# Patient Record
Sex: Male | Born: 1960
Health system: Southern US, Community
[De-identification: ages and names within clinical notes are randomized; demographics above are authoritative.]

## PROBLEM LIST (undated history)

## (undated) DIAGNOSIS — F439 Reaction to severe stress, unspecified: Secondary | ICD-10-CM

## (undated) DIAGNOSIS — F17290 Nicotine dependence, other tobacco product, uncomplicated: Secondary | ICD-10-CM

## (undated) DIAGNOSIS — K30 Functional dyspepsia: Secondary | ICD-10-CM

## (undated) DIAGNOSIS — N281 Cyst of kidney, acquired: Secondary | ICD-10-CM

## (undated) DIAGNOSIS — E663 Overweight: Secondary | ICD-10-CM

## (undated) DIAGNOSIS — K219 Gastro-esophageal reflux disease without esophagitis: Secondary | ICD-10-CM

## (undated) DIAGNOSIS — I10 Essential (primary) hypertension: Secondary | ICD-10-CM

## (undated) DIAGNOSIS — Z86010 Personal history of colonic polyps: Secondary | ICD-10-CM

## (undated) DIAGNOSIS — H409 Unspecified glaucoma: Secondary | ICD-10-CM

## (undated) DIAGNOSIS — J329 Chronic sinusitis, unspecified: Secondary | ICD-10-CM

## (undated) HISTORY — DX: Chronic sinusitis, unspecified: J32.9

## (undated) HISTORY — DX: Nicotine dependence, other tobacco product, uncomplicated: F17.290

## (undated) HISTORY — DX: Functional dyspepsia: K30

## (undated) HISTORY — DX: Cyst of kidney, acquired: N28.1

## (undated) HISTORY — DX: Unspecified glaucoma: H40.9

## (undated) HISTORY — DX: Personal history of colonic polyps: Z86.010

## (undated) HISTORY — DX: Reaction to severe stress, unspecified: F43.9

## (undated) HISTORY — DX: Overweight: E66.3

---

## 2010-06-21 ENCOUNTER — Emergency Department (HOSPITAL_COMMUNITY): Admission: EM | Admit: 2010-06-21 | Discharge: 2010-06-21 | Payer: Self-pay | Admitting: Emergency Medicine

## 2010-06-30 ENCOUNTER — Encounter: Admission: RE | Admit: 2010-06-30 | Discharge: 2010-06-30 | Payer: Self-pay | Admitting: Family Medicine

## 2010-08-11 ENCOUNTER — Encounter
Admission: RE | Admit: 2010-08-11 | Discharge: 2010-08-11 | Payer: Self-pay | Source: Home / Self Care | Attending: Family Medicine | Admitting: Family Medicine

## 2010-10-27 LAB — POCT URINALYSIS DIPSTICK
Glucose, UA: NEGATIVE mg/dL
Hgb urine dipstick: NEGATIVE
Nitrite: NEGATIVE
Urobilinogen, UA: 0.2 mg/dL (ref 0.0–1.0)
pH: 6.5 (ref 5.0–8.0)

## 2012-10-21 ENCOUNTER — Encounter (HOSPITAL_COMMUNITY): Payer: Self-pay | Admitting: *Deleted

## 2012-10-21 ENCOUNTER — Emergency Department (HOSPITAL_COMMUNITY)
Admission: EM | Admit: 2012-10-21 | Discharge: 2012-10-21 | Disposition: A | Discharge status: Home / Self Care | Payer: BC Managed Care – PPO | Source: Home / Self Care

## 2012-10-21 DIAGNOSIS — J069 Acute upper respiratory infection, unspecified: Secondary | ICD-10-CM

## 2012-10-21 DIAGNOSIS — Z720 Tobacco use: Secondary | ICD-10-CM

## 2012-10-21 DIAGNOSIS — J9801 Acute bronchospasm: Secondary | ICD-10-CM

## 2012-10-21 DIAGNOSIS — F17200 Nicotine dependence, unspecified, uncomplicated: Secondary | ICD-10-CM

## 2012-10-21 MED ORDER — ALBUTEROL SULFATE HFA 108 (90 BASE) MCG/ACT IN AERS: 2.0000 | INHALATION_SPRAY | Freq: Four times a day (QID) | RESPIRATORY_TRACT | Status: DC | PRN

## 2012-10-21 MED ORDER — AZITHROMYCIN 250 MG PO TABS: 250.0000 mg | ORAL_TABLET | Freq: Every day | ORAL | Status: DC

## 2012-10-21 NOTE — ED Provider Notes (Signed)
History     CSN: [id number]  Arrival date & time 10/21/12  1455   First MD Initiated Contact with Patient 10/21/12 1458      Chief Complaint  Patient presents with  . URI    (Consider location/radiation/quality/duration/timing/severity/associated sxs/prior treatment) HPI Comments: 52 year old man presents with cough, chills, fever of 101 in previous days associated with PND. Denies sore throat or earache. He admits to smoking cigars.  Patient is a 52 y.o. male presenting with URI.  URI Presenting symptoms: congestion, cough and fever   Presenting symptoms: no ear pain, no fatigue, no rhinorrhea and no sore throat   Associated symptoms: no neck pain     History reviewed. No pertinent past medical history.  History reviewed. No pertinent past surgical history.  No family history on file.  History  Substance Use Topics  . Smoking status: Never Smoker   . Smokeless tobacco: Not on file  . Alcohol Use: No      Review of Systems  Constitutional: Positive for fever and activity change. Negative for diaphoresis and fatigue.  HENT: Positive for congestion and postnasal drip. Negative for ear pain, sore throat, facial swelling, rhinorrhea, trouble swallowing, neck pain and neck stiffness.   Eyes: Negative for pain, discharge and redness.  Respiratory: Positive for cough. Negative for chest tightness and shortness of breath.   Cardiovascular: Negative.   Gastrointestinal: Negative.   Musculoskeletal: Negative.   Skin: Negative.   Neurological: Negative.   Psychiatric/Behavioral: Negative.     Allergies  Mucinex  Home Medications   Current Outpatient Rx  Name  Route  Sig  Dispense  Refill  . albuterol (PROVENTIL HFA;VENTOLIN HFA) 108 (90 BASE) MCG/ACT inhaler   Inhalation   Inhale 2 puffs into the lungs every 6 (six) hours as needed for wheezing.   1 Inhaler   0   . azithromycin (ZITHROMAX) 250 MG tablet   Oral   Take 1 tablet (250 mg total) by mouth daily. 2  tabs po on day one, then one tablet po once daily on days 2-5.   6 tablet   0     BP 145/99  Pulse 84  Temp(Src) 98.7 F (37.1 C) (Oral)  Resp 20  SpO2 93%  Physical Exam  Nursing note and vitals reviewed. Constitutional: He is oriented to person, place, and time. He appears well-developed and well-nourished. No distress.  Maintained relaxed posturing appears to be in no distress.  HENT:  Bilateral TMs are normal Oropharynx with minor erythema and clear PND.  Neck: Normal range of motion. Neck supple.  Cardiovascular: Normal rate, regular rhythm and normal heart sounds.   Pulmonary/Chest: Effort normal. No respiratory distress.  Mildly prolonged expiratory phase. End expiratory wheezes. No crackles or rales. Air entry is normal. The chest expansion. No observable evidence of respiratory difficulty or tachypneic.  Abdominal: Soft. There is no tenderness.  Musculoskeletal: Normal range of motion. He exhibits no edema.  Lymphadenopathy:    He has no cervical adenopathy.  Neurological: He is alert and oriented to person, place, and time.  Skin: Skin is warm and dry. No rash noted.  Psychiatric: He has a normal mood and affect.    ED Course  Procedures (including critical care time)  Labs Reviewed - No data to display No results found.   1. URI (upper respiratory infection)   2. Bronchospasm   3. Tobacco abuse       MDM  Albuterol HFA 2 puffs every 4 hours when necessary cough and  wheeze Recommend Allegra or Claritin as needed for drainage and may also add Sudafed PE 10 mg every 4 hours as needed. Z-Pak Drink plenty of fluids stay well hydrated Followup your primary care doctor as needed. Stop smoking        Hayden Rasmussen, NP 10/21/12 1739

## 2012-10-21 NOTE — ED Notes (Signed)
Patient complains of chest pain and congestion, head congestion and cough. States fever/chills and sore throat x 2 weeks with fatigue.

## 2012-10-22 NOTE — ED Provider Notes (Signed)
  I have directly reviewed the clinical findings, lab, imaging studies and management of this patient in detail. I  agree with the documentation,  as recorded by the Physician extender.  SINGH,PRASHANT K M.D on 10/22/2012 at 12:56 PM  [facility] Office  [phone] 

## 2016-04-20 DIAGNOSIS — H52223 Regular astigmatism, bilateral: Secondary | ICD-10-CM

## 2016-04-20 DIAGNOSIS — H04123 Dry eye syndrome of bilateral lacrimal glands: Secondary | ICD-10-CM

## 2016-04-20 DIAGNOSIS — H401113 Primary open-angle glaucoma, right eye, severe stage: Secondary | ICD-10-CM

## 2016-04-20 HISTORY — DX: Dry eye syndrome of bilateral lacrimal glands: H04.123

## 2016-04-20 HISTORY — DX: Primary open-angle glaucoma, right eye, severe stage: H40.1113

## 2016-04-20 HISTORY — DX: Regular astigmatism, bilateral: H52.223

## 2017-05-06 ENCOUNTER — Other Ambulatory Visit: Payer: Self-pay | Admitting: Internal Medicine

## 2017-05-06 ENCOUNTER — Ambulatory Visit
Admission: RE | Admit: 2017-05-06 | Discharge: 2017-05-06 | Disposition: A | Discharge status: Home / Self Care | Payer: BC Managed Care – PPO | Source: Ambulatory Visit | Attending: Internal Medicine | Admitting: Internal Medicine

## 2017-05-06 DIAGNOSIS — R0789 Other chest pain: Secondary | ICD-10-CM

## 2017-05-19 DIAGNOSIS — H401122 Primary open-angle glaucoma, left eye, moderate stage: Secondary | ICD-10-CM

## 2017-05-19 HISTORY — DX: Primary open-angle glaucoma, left eye, moderate stage: H40.1122

## 2017-05-20 ENCOUNTER — Ambulatory Visit
Admission: RE | Admit: 2017-05-20 | Discharge: 2017-05-20 | Disposition: A | Discharge status: Home / Self Care | Payer: BC Managed Care – PPO | Source: Ambulatory Visit | Attending: Internal Medicine | Admitting: Internal Medicine

## 2017-05-20 ENCOUNTER — Other Ambulatory Visit: Payer: Self-pay | Admitting: Internal Medicine

## 2017-05-20 DIAGNOSIS — I10 Essential (primary) hypertension: Secondary | ICD-10-CM

## 2017-05-26 ENCOUNTER — Emergency Department (HOSPITAL_BASED_OUTPATIENT_CLINIC_OR_DEPARTMENT_OTHER): Payer: BC Managed Care – PPO

## 2017-05-26 ENCOUNTER — Emergency Department (HOSPITAL_BASED_OUTPATIENT_CLINIC_OR_DEPARTMENT_OTHER)
Admission: EM | Admit: 2017-05-26 | Discharge: 2017-05-26 | Disposition: A | Discharge status: Home / Self Care | Payer: BC Managed Care – PPO | Attending: Emergency Medicine | Admitting: Emergency Medicine

## 2017-05-26 ENCOUNTER — Encounter (HOSPITAL_BASED_OUTPATIENT_CLINIC_OR_DEPARTMENT_OTHER): Payer: Self-pay

## 2017-05-26 ENCOUNTER — Other Ambulatory Visit: Payer: Self-pay | Admitting: Internal Medicine

## 2017-05-26 DIAGNOSIS — F17290 Nicotine dependence, other tobacco product, uncomplicated: Secondary | ICD-10-CM | POA: Diagnosis not present

## 2017-05-26 DIAGNOSIS — Y939 Activity, unspecified: Secondary | ICD-10-CM | POA: Diagnosis not present

## 2017-05-26 DIAGNOSIS — R9389 Abnormal findings on diagnostic imaging of other specified body structures: Secondary | ICD-10-CM

## 2017-05-26 DIAGNOSIS — M25512 Pain in left shoulder: Secondary | ICD-10-CM | POA: Insufficient documentation

## 2017-05-26 DIAGNOSIS — W010XXA Fall on same level from slipping, tripping and stumbling without subsequent striking against object, initial encounter: Secondary | ICD-10-CM | POA: Insufficient documentation

## 2017-05-26 DIAGNOSIS — Y929 Unspecified place or not applicable: Secondary | ICD-10-CM | POA: Insufficient documentation

## 2017-05-26 DIAGNOSIS — Y999 Unspecified external cause status: Secondary | ICD-10-CM | POA: Insufficient documentation

## 2017-05-26 HISTORY — DX: Essential (primary) hypertension: I10

## 2017-05-26 HISTORY — DX: Gastro-esophageal reflux disease without esophagitis: K21.9

## 2017-05-26 MED ORDER — ACETAMINOPHEN 500 MG PO TABS
500.0000 mg | ORAL_TABLET | Freq: Once | ORAL | Status: AC
  Administered 2017-05-26: 500 mg via ORAL
  Filled 2017-05-26: qty 1

## 2017-05-26 MED ORDER — CYCLOBENZAPRINE HCL 10 MG PO TABS: 10.0000 mg | ORAL_TABLET | Freq: Two times a day (BID) | ORAL | 0 refills | Status: DC | PRN

## 2017-05-26 MED ORDER — IBUPROFEN 200 MG PO TABS
600.0000 mg | ORAL_TABLET | Freq: Once | ORAL | Status: AC
  Administered 2017-05-26: 600 mg via ORAL
  Filled 2017-05-26: qty 1

## 2017-05-26 NOTE — ED Notes (Signed)
ED Provider at bedside. 

## 2017-05-26 NOTE — Discharge Instructions (Signed)
Please rest, ice, compress and elevated the affected body part to help with swelling and pain.  Please the the flexeril for muscle relaxation. This medication will make you drowsy so avoid situation that could place you in danger.   Medications Motrin and Tylenol for pain.  Please follow up with orthopedic doctor.

## 2017-05-26 NOTE — ED Triage Notes (Signed)
Pt states he slipped/fell approx 510pm-pain to left shoulder-NAD-steady gait

## 2017-05-26 NOTE — ED Provider Notes (Signed)
[place] [facility] Provider Note   CSN: [id number] Arrival date & time: 05/26/17  1813     History   Chief Complaint Chief Complaint  Patient presents with  . Fall    HPI James Ellison is a 56 y.o. male.  HPI 56 year old African-American male with no significant past medical history presents to the ED for evaluation of left shoulder pain after mechanical fall prior to arrival. Patient states that he was getting out of his truck and slipped on the step and fell backwards. Patient was trying to prevent from hitting his head and fell onto his left shoulder. States the left shoulder pain is worse with movement. Holding still makes the pain better. He has not taken anything for the pain prior to arrival. Patient denies any head injury or LOC. Denies any associative neck pain, back pain, chest pain, shortness of breath, abdominal pain, nausea, emesis. Denies any associated paresthesias or weakness. Past Medical History:  Diagnosis Date  . GERD (gastroesophageal reflux disease)   . Hypertension     There are no active problems to display for this patient.   History reviewed. No pertinent surgical history.     Home Medications    Prior to Admission medications   Medication Sig Start Date End Date Taking? Authorizing Provider  UNKNOWN TO PATIENT HTN, GERD meds   Yes [provider]    Family History No family history on file.  Social History Social History  Substance Use Topics  . Smoking status: Current Every Day Smoker    Types: Cigars  . Smokeless tobacco: Never Used  . Alcohol use Yes     Comment: occ     Allergies   Mucinex [guaifenesin er]   Review of Systems Review of Systems  Gastrointestinal: Negative for nausea and vomiting.  Musculoskeletal: Positive for arthralgias and joint swelling. Negative for back pain, myalgias, neck pain and neck stiffness.  Skin: Negative for color change and wound.  Neurological: Negative for dizziness,  syncope, weakness, light-headedness, numbness and headaches.     Physical Exam Updated Vital Signs BP (!) 155/104 (BP Location: Right Arm)   Pulse 87   Temp 99.6 F (37.6 C) (Oral)   Resp 18   SpO2 98%   Physical Exam  Constitutional: He appears well-developed and well-nourished. No distress.  HENT:  Head: Normocephalic and atraumatic.  Eyes: Right eye exhibits no discharge. Left eye exhibits no discharge. No scleral icterus.  Neck: Normal range of motion. Neck supple.  No c spine midline tenderness. No paraspinal tenderness. No deformities or step offs noted. Full ROM. Supple. No nuchal rigidity. \   Cardiovascular: Normal rate and regular rhythm.   Pulmonary/Chest: Effort normal and breath sounds normal. No respiratory distress. He exhibits no tenderness.  Musculoskeletal:       Left shoulder: He exhibits decreased range of motion, tenderness, bony tenderness, deformity and pain. He exhibits no swelling, no effusion, no crepitus, no laceration, no spasm, normal pulse and normal strength.  Limited range of motion due to pain. No obvious deformity, ecchymosis, edema. Grip strength normal. Full range of motion left elbow and left wrist without pain. Radial pulses 2+ bilaterally.  No midline T spine or L spine tenderness. No deformities or step offs noted. Full ROM. Pelvis is stable.   Neurological: He is alert.  Sensation intact in all dermatomes. Grip strength normal.  Skin: No pallor.  Psychiatric: His behavior is normal. Judgment and thought content normal.  Nursing note and vitals reviewed.  ED Treatments / Results  Labs (all labs ordered are listed, but only abnormal results are displayed) Labs Reviewed - No data to display  EKG  EKG Interpretation None       Radiology No results found.  Procedures Procedures (including critical care time)  Medications Ordered in ED Medications  ibuprofen (ADVIL,MOTRIN) tablet 600 mg (not administered)  acetaminophen  (TYLENOL) tablet 500 mg (not administered)     Initial Impression / Assessment and Plan / ED Course  I have reviewed the triage vital signs and the nursing notes.  Pertinent labs & imaging results that were available during my care of the patient were reviewed by me and considered in my medical decision making (see chart for details).     Patient X-Ray negative for obvious fracture or dislocation. Pain managed in ED. Pt advised to follow up with orthopedics if symptoms persist for possibility of missed fracture diagnosis. Patient given sling while in ED, conservative therapy recommended and discussed. Patient will be dc home & is agreeable with above plan.   Final Clinical Impressions(s) / ED Diagnoses   Final diagnoses:  Acute pain of left shoulder    New Prescriptions New Prescriptions   No medications on file     Aaron Edelman 05/26/17 2018    Fredia Sorrow, MD 05/27/17 [phone]

## 2017-05-30 ENCOUNTER — Ambulatory Visit
Admission: RE | Admit: 2017-05-30 | Discharge: 2017-05-30 | Disposition: A | Discharge status: Home / Self Care | Payer: BC Managed Care – PPO | Source: Ambulatory Visit | Attending: Internal Medicine | Admitting: Internal Medicine

## 2017-05-30 DIAGNOSIS — R9389 Abnormal findings on diagnostic imaging of other specified body structures: Secondary | ICD-10-CM

## 2017-05-31 ENCOUNTER — Ambulatory Visit (INDEPENDENT_AMBULATORY_CARE_PROVIDER_SITE_OTHER): Payer: BC Managed Care – PPO | Admitting: Family Medicine

## 2017-05-31 ENCOUNTER — Encounter: Payer: Self-pay | Admitting: Family Medicine

## 2017-05-31 DIAGNOSIS — S4992XA Unspecified injury of left shoulder and upper arm, initial encounter: Secondary | ICD-10-CM

## 2017-05-31 NOTE — Patient Instructions (Signed)
You have strained your rotator cuff of your left shoulder. Try to avoid painful activities (overhead activities, lifting with extended arm) as much as possible. Aleve 2 tabs twice a day with food OR motrin 3 tabs three times a day with food for pain and inflammation. Can take tylenol in addition to this. I wouldn't recommend an injection given you had an acute injury. Start physical therapy with transition to home exercise program. Do home exercise program with theraband and scapular stabilization exercises daily 3 sets of 10 once a day. If not improving at follow-up we will consider further imaging (MRI). Follow up with me in 1 month to 6 weeks.

## 2017-06-02 DIAGNOSIS — S4992XD Unspecified injury of left shoulder and upper arm, subsequent encounter: Secondary | ICD-10-CM | POA: Insufficient documentation

## 2017-06-02 HISTORY — DX: Unspecified injury of left shoulder and upper arm, subsequent encounter: S49.92XD

## 2017-06-02 NOTE — Assessment & Plan Note (Signed)
2/2 rotator cuff strain.  Aleve or ibuprofen.  Tylenol if needed.  Start physical therapy and home exercises.  Consider MRI if not improving as expected.  F/u in 1 month to 6 weeks.

## 2017-06-02 NOTE — Progress Notes (Signed)
PCP: Seward Carol, MD  Subjective:   HPI: Patient is a 56 y.o. male here for left shoulder injury.  Patient reports on 10/11 he accidentally stepped in oil/water mixture when getting out of his truck and slipped, landing directly onto left shoulder. Couldn't move much after this. Pain level 4/10 but up to 7/10 and sharp at worst. Pain is lateral. Has tried motrin, aleve pm for this with mild benefit. Has to sleep a certain way due to pain. No skin changes, numbness. Right handed.  Past Medical History:  Diagnosis Date  . GERD (gastroesophageal reflux disease)   . Hypertension     Current Outpatient Prescriptions on File Prior to Visit  Medication Sig Dispense Refill  . cyclobenzaprine (FLEXERIL) 10 MG tablet Take 1 tablet (10 mg total) by mouth 2 (two) times daily as needed for muscle spasms. 10 tablet 0   No current facility-administered medications on file prior to visit.     No past surgical history on file.  Allergies  Allergen Reactions  . Mucinex [Guaifenesin Er] Other (See Comments)    Patient states bladder pain after taking medication.    Social History   Social History  . Marital status: Married    Spouse name: N/A  . Number of children: N/A  . Years of education: N/A   Occupational History  . Not on file.   Social History Main Topics  . Smoking status: Current Every Day Smoker    Types: Cigars  . Smokeless tobacco: Never Used  . Alcohol use Yes     Comment: occ  . Drug use: No  . Sexual activity: Not on file   Other Topics Concern  . Not on file   Social History Narrative  . No narrative on file    No family history on file.  BP (!) 136/95   Pulse 71   Ht 5\' 11"  (1.803 m)   Wt 252 lb (114.3 kg)   BMI 35.15 kg/m   Review of Systems: See HPI above.     Objective:  Physical Exam:  Gen: NAD, comfortable in exam room  Left shoulder: No swelling, ecchymoses.  No gross deformity. No TTP. FROM with painful arc. Negative  Hawkins, positive Neers. Negative Yergasons. Strength 4/5 with empty can and resisted external rotation.  5/5 internal rotation.  Pain empty can and ER. Negative apprehension. NV intact distally.  Right shoulder: No swelling, ecchymoses.  No gross deformity. No TTP. FROM. Strength 5/5 with empty can and resisted internal/external rotation. NV intact distally.   Assessment & Plan:  1. Left shoulder pain - 2/2 rotator cuff strain.  Aleve or ibuprofen.  Tylenol if needed.  Start physical therapy and home exercises.  Consider MRI if not improving as expected.  F/u in 1 month to 6 weeks.

## 2017-07-05 ENCOUNTER — Encounter: Payer: Self-pay | Admitting: Family Medicine

## 2017-07-05 ENCOUNTER — Ambulatory Visit (INDEPENDENT_AMBULATORY_CARE_PROVIDER_SITE_OTHER): Payer: BC Managed Care – PPO | Admitting: Family Medicine

## 2017-07-05 DIAGNOSIS — S4992XD Unspecified injury of left shoulder and upper arm, subsequent encounter: Secondary | ICD-10-CM | POA: Diagnosis not present

## 2017-07-05 NOTE — Patient Instructions (Signed)
You have strained your rotator cuff of your left shoulder. Try to avoid painful activities (overhead activities, lifting with extended arm) as much as possible. Tylenol, aleve or motrin only if needed Start physical therapy with transition to home exercise program. Do home exercise program with theraband and scapular stabilization exercises daily 3 sets of 10 once a day. If not improving at follow-up we will consider further imaging (MRI). Follow up with me in 6 weeks for reevaluation.

## 2017-07-06 ENCOUNTER — Encounter: Payer: Self-pay | Admitting: Family Medicine

## 2017-07-06 NOTE — Assessment & Plan Note (Signed)
2/2 rotator cuff strain.  Improving with home exercises - will start physical therapy as well.  Tylenol, motrin if needed.  Consider MRI if not continuing to improve.  F/u in 6 weeks.

## 2017-07-06 NOTE — Progress Notes (Signed)
PCP: Seward Carol, MD  Subjective:   HPI: Patient is a 56 y.o. male here for left shoulder injury.  10/16: Patient reports on 10/11 he accidentally stepped in oil/water mixture when getting out of his truck and slipped, landing directly onto left shoulder. Couldn't move much after this. Pain level 4/10 but up to 7/10 and sharp at worst. Pain is lateral. Has tried motrin, aleve pm for this with mild benefit. Has to sleep a certain way due to pain. No skin changes, numbness. Right handed.  11/20: Patient reports he feels a lot better - about 70%. He's doing home exercises. Takes motrin occasionally and motrin PM in evening. + night pain. Pain level 5/10, worse lying on this side and with overhead motions. No skin changes. No new injuries.  Past Medical History:  Diagnosis Date  . GERD (gastroesophageal reflux disease)   . Hypertension     Current Outpatient Medications on File Prior to Visit  Medication Sig Dispense Refill  . ALPRAZolam (XANAX) 0.25 MG tablet TK 1 T PO BID PRN  0  . cyclobenzaprine (FLEXERIL) 10 MG tablet Take 1 tablet (10 mg total) by mouth 2 (two) times daily as needed for muscle spasms. 10 tablet 0  . hydrochlorothiazide (HYDRODIURIL) 25 MG tablet TK 1 T PO QAM  6  . latanoprost (XALATAN) 0.005 % ophthalmic solution INT 1 GTT IN OU QHS  11  . pantoprazole (PROTONIX) 40 MG tablet Take 40 mg by mouth daily.  3  . SIMBRINZA 1-0.2 % SUSP SHAKE LQ AND INT 1 GTT IN OU TID  11  . timolol (TIMOPTIC) 0.5 % ophthalmic solution INT 1 GTT INTO OS BID  3   No current facility-administered medications on file prior to visit.     History reviewed. No pertinent surgical history.  Allergies  Allergen Reactions  . Mucinex [Guaifenesin Er] Other (See Comments)    Patient states bladder pain after taking medication.    Social History   Socioeconomic History  . Marital status: Married    Spouse name: Not on file  . Number of children: Not on file  . Years of  education: Not on file  . Highest education level: Not on file  Social Needs  . Financial resource strain: Not on file  . Food insecurity - worry: Not on file  . Food insecurity - inability: Not on file  . Transportation needs - medical: Not on file  . Transportation needs - non-medical: Not on file  Occupational History  . Not on file  Tobacco Use  . Smoking status: Current Every Day Smoker    Types: Cigars  . Smokeless tobacco: Never Used  Substance and Sexual Activity  . Alcohol use: Yes    Comment: occ  . Drug use: No  . Sexual activity: Not on file  Other Topics Concern  . Not on file  Social History Narrative  . Not on file    History reviewed. No pertinent family history.  BP (!) 133/91   Pulse 79   Ht 5\' 11"  (1.803 m)   Wt 247 lb (112 kg)   BMI 34.45 kg/m   Review of Systems: See HPI above.     Objective:  Physical Exam:  Gen: NAD, comfortable in exam room  Left shoulder: No swelling, ecchymoses.  No gross deformity. No TTP. FROM with painful arc. Negative Hawkins, positive Neers. Negative Yergasons. Strength 5-/5 with external rotation, 5/5 IR and empty can.  Pain with empty can and ER. Negative  apprehension. NV intact distally.   Assessment & Plan:  1. Left shoulder pain - 2/2 rotator cuff strain.  Improving with home exercises - will start physical therapy as well.  Tylenol, motrin if needed.  Consider MRI if not continuing to improve.  F/u in 6 weeks.

## 2017-08-17 ENCOUNTER — Ambulatory Visit: Payer: BC Managed Care – PPO | Admitting: Family Medicine

## 2017-08-22 ENCOUNTER — Ambulatory Visit: Payer: 59 | Admitting: Family Medicine

## 2017-08-24 DIAGNOSIS — H401121 Primary open-angle glaucoma, left eye, mild stage: Secondary | ICD-10-CM | POA: Diagnosis not present

## 2017-08-24 DIAGNOSIS — H25813 Combined forms of age-related cataract, bilateral: Secondary | ICD-10-CM | POA: Diagnosis not present

## 2017-08-24 DIAGNOSIS — H401113 Primary open-angle glaucoma, right eye, severe stage: Secondary | ICD-10-CM | POA: Diagnosis not present

## 2017-08-29 ENCOUNTER — Ambulatory Visit (INDEPENDENT_AMBULATORY_CARE_PROVIDER_SITE_OTHER): Payer: 59 | Admitting: Family Medicine

## 2017-08-29 ENCOUNTER — Encounter: Payer: Self-pay | Admitting: Family Medicine

## 2017-08-29 DIAGNOSIS — S4992XD Unspecified injury of left shoulder and upper arm, subsequent encounter: Secondary | ICD-10-CM

## 2017-08-29 NOTE — Patient Instructions (Signed)
You're improving as expected from your rotator cuff strain. Finish physical therapy and transition to home exercise program. Do the home exercises 3-4 times a week for another 6 weeks even if your pain goes completely away. I would wait another few weeks before doing overhead presses, incline bench press but all other exercises should be ok now including flys. Tylenol, aleve or motrin only if needed Follow up with me in 6 weeks or as needed.

## 2017-08-30 ENCOUNTER — Encounter: Payer: Self-pay | Admitting: Family Medicine

## 2017-08-30 NOTE — Progress Notes (Signed)
PCP: Seward Carol, MD  Subjective:   HPI: Patient is a 57 y.o. male here for left shoulder injury.  10/16: Patient reports on 10/11 he accidentally stepped in oil/water mixture when getting out of his truck and slipped, landing directly onto left shoulder. Couldn't move much after this. Pain level 4/10 but up to 7/10 and sharp at worst. Pain is lateral. Has tried motrin, aleve pm for this with mild benefit. Has to sleep a certain way due to pain. No skin changes, numbness. Right handed.  11/20: Patient reports he feels a lot better - about 70%. He's doing home exercises. Takes motrin occasionally and motrin PM in evening. + night pain. Pain level 5/10, worse lying on this side and with overhead motions. No skin changes. No new injuries.  08/29/16: Patient reports he's doing pretty well. Pain level down to 2/10 and more dull. Still bothers with overhead motion. Would like to start doing weights again. No night pain, not taking any medicines. Not interfering with work. No skin changes, numbness.  Past Medical History:  Diagnosis Date  . GERD (gastroesophageal reflux disease)   . Hypertension     Current Outpatient Medications on File Prior to Visit  Medication Sig Dispense Refill  . acetaZOLAMIDE (DIAMOX) 500 MG capsule TK 1 C PO BID  2  . ALPRAZolam (XANAX) 0.25 MG tablet TK 1 T PO BID PRN  0  . hydrochlorothiazide (HYDRODIURIL) 25 MG tablet TK 1 T PO QAM  6  . latanoprost (XALATAN) 0.005 % ophthalmic solution INT 1 GTT IN OU QHS  11  . Latanoprostene Bunod (VYZULTA) 0.024 % SOLN Apply to eye.    Mckinley Jewel Dimesylate (RHOPRESSA) 0.02 % SOLN Apply to eye.    . pantoprazole (PROTONIX) 40 MG tablet Take 40 mg by mouth daily.  3  . SIMBRINZA 1-0.2 % SUSP SHAKE LQ AND INT 1 GTT IN OU TID  11  . timolol (TIMOPTIC) 0.5 % ophthalmic solution INT 1 GTT INTO OS BID  3   No current facility-administered medications on file prior to visit.     History reviewed. No  pertinent surgical history.  Allergies  Allergen Reactions  . Mucinex [Guaifenesin Er] Other (See Comments)    Patient states bladder pain after taking medication.    Social History   Socioeconomic History  . Marital status: Married    Spouse name: Not on file  . Number of children: Not on file  . Years of education: Not on file  . Highest education level: Not on file  Social Needs  . Financial resource strain: Not on file  . Food insecurity - worry: Not on file  . Food insecurity - inability: Not on file  . Transportation needs - medical: Not on file  . Transportation needs - non-medical: Not on file  Occupational History  . Not on file  Tobacco Use  . Smoking status: Current Every Day Smoker    Types: Cigars  . Smokeless tobacco: Never Used  Substance and Sexual Activity  . Alcohol use: Yes    Comment: occ  . Drug use: No  . Sexual activity: Not on file  Other Topics Concern  . Not on file  Social History Narrative  . Not on file    History reviewed. No pertinent family history.  BP (!) 143/92   Pulse 86   Ht 6' (1.829 m)   Wt 235 lb (106.6 kg)   BMI 31.87 kg/m   Review of Systems: See HPI above.  Objective:  Physical Exam:  Gen: NAD, comfortable in exam room.  Left shoulder: No swelling, ecchymoses.  No gross deformity. No TTP. FROM with mild painful arc. Negative Hawkins, Neers. Negative Yergasons. Strength 5/5 with empty can and resisted internal/external rotation.  Mild pain empty can. Negative apprehension. NV intact distally.   Assessment & Plan:  1. Left shoulder pain - 2/2 rotator cuff strain, improving.  Continue with physical therapy and transition to home exercise program.  Ok to work out in gym but avoid overhead presses, incline bench for now.  Tylenol, aleve, or motrin only if needed.  F/u in 6 weeks or prn.

## 2017-08-30 NOTE — Assessment & Plan Note (Signed)
2/2 rotator cuff strain, improving.  Continue with physical therapy and transition to home exercise program.  Ok to work out in gym but avoid overhead presses, incline bench for now.  Tylenol, aleve, or motrin only if needed.  F/u in 6 weeks or prn.

## 2017-09-12 DIAGNOSIS — H401113 Primary open-angle glaucoma, right eye, severe stage: Secondary | ICD-10-CM | POA: Diagnosis not present

## 2017-09-12 DIAGNOSIS — H25813 Combined forms of age-related cataract, bilateral: Secondary | ICD-10-CM | POA: Diagnosis not present

## 2017-09-12 DIAGNOSIS — H401121 Primary open-angle glaucoma, left eye, mild stage: Secondary | ICD-10-CM | POA: Diagnosis not present

## 2017-10-10 ENCOUNTER — Ambulatory Visit: Payer: 59 | Admitting: Family Medicine

## 2017-10-21 DIAGNOSIS — H04123 Dry eye syndrome of bilateral lacrimal glands: Secondary | ICD-10-CM | POA: Diagnosis not present

## 2017-10-21 DIAGNOSIS — H16211 Exposure keratoconjunctivitis, right eye: Secondary | ICD-10-CM | POA: Diagnosis not present

## 2017-11-07 DIAGNOSIS — H401122 Primary open-angle glaucoma, left eye, moderate stage: Secondary | ICD-10-CM | POA: Diagnosis not present

## 2017-11-07 DIAGNOSIS — H409 Unspecified glaucoma: Secondary | ICD-10-CM | POA: Diagnosis not present

## 2017-11-07 DIAGNOSIS — H401120 Primary open-angle glaucoma, left eye, stage unspecified: Secondary | ICD-10-CM | POA: Diagnosis not present

## 2018-01-18 DIAGNOSIS — H401122 Primary open-angle glaucoma, left eye, moderate stage: Secondary | ICD-10-CM | POA: Diagnosis not present

## 2018-01-18 DIAGNOSIS — H401113 Primary open-angle glaucoma, right eye, severe stage: Secondary | ICD-10-CM | POA: Diagnosis not present

## 2018-02-28 DIAGNOSIS — H409 Unspecified glaucoma: Secondary | ICD-10-CM | POA: Diagnosis not present

## 2018-02-28 DIAGNOSIS — Z13220 Encounter for screening for lipoid disorders: Secondary | ICD-10-CM | POA: Diagnosis not present

## 2018-02-28 DIAGNOSIS — I10 Essential (primary) hypertension: Secondary | ICD-10-CM | POA: Diagnosis not present

## 2018-05-04 DIAGNOSIS — Z86010 Personal history of colonic polyps: Secondary | ICD-10-CM | POA: Diagnosis not present

## 2018-05-04 DIAGNOSIS — H401113 Primary open-angle glaucoma, right eye, severe stage: Secondary | ICD-10-CM | POA: Diagnosis not present

## 2018-05-04 DIAGNOSIS — H04123 Dry eye syndrome of bilateral lacrimal glands: Secondary | ICD-10-CM | POA: Diagnosis not present

## 2018-05-04 DIAGNOSIS — H401122 Primary open-angle glaucoma, left eye, moderate stage: Secondary | ICD-10-CM | POA: Diagnosis not present

## 2018-05-08 DIAGNOSIS — H25813 Combined forms of age-related cataract, bilateral: Secondary | ICD-10-CM | POA: Diagnosis not present

## 2018-05-08 DIAGNOSIS — H401121 Primary open-angle glaucoma, left eye, mild stage: Secondary | ICD-10-CM | POA: Diagnosis not present

## 2018-05-08 DIAGNOSIS — H401113 Primary open-angle glaucoma, right eye, severe stage: Secondary | ICD-10-CM | POA: Diagnosis not present

## 2018-05-17 DIAGNOSIS — H04123 Dry eye syndrome of bilateral lacrimal glands: Secondary | ICD-10-CM | POA: Diagnosis not present

## 2018-05-17 DIAGNOSIS — H401122 Primary open-angle glaucoma, left eye, moderate stage: Secondary | ICD-10-CM | POA: Diagnosis not present

## 2018-05-17 DIAGNOSIS — H401113 Primary open-angle glaucoma, right eye, severe stage: Secondary | ICD-10-CM | POA: Diagnosis not present

## 2018-05-23 DIAGNOSIS — Z86010 Personal history of colonic polyps: Secondary | ICD-10-CM | POA: Diagnosis not present

## 2018-05-23 DIAGNOSIS — K5730 Diverticulosis of large intestine without perforation or abscess without bleeding: Secondary | ICD-10-CM | POA: Diagnosis not present

## 2018-05-23 DIAGNOSIS — D125 Benign neoplasm of sigmoid colon: Secondary | ICD-10-CM | POA: Diagnosis not present

## 2018-07-03 DIAGNOSIS — H401121 Primary open-angle glaucoma, left eye, mild stage: Secondary | ICD-10-CM | POA: Diagnosis not present

## 2018-07-03 DIAGNOSIS — H401113 Primary open-angle glaucoma, right eye, severe stage: Secondary | ICD-10-CM | POA: Diagnosis not present

## 2018-07-03 DIAGNOSIS — H25813 Combined forms of age-related cataract, bilateral: Secondary | ICD-10-CM | POA: Diagnosis not present

## 2018-09-27 DIAGNOSIS — M19071 Primary osteoarthritis, right ankle and foot: Secondary | ICD-10-CM | POA: Diagnosis not present

## 2018-09-27 DIAGNOSIS — M65871 Other synovitis and tenosynovitis, right ankle and foot: Secondary | ICD-10-CM | POA: Diagnosis not present

## 2018-09-27 DIAGNOSIS — M25571 Pain in right ankle and joints of right foot: Secondary | ICD-10-CM | POA: Diagnosis not present

## 2018-10-16 DIAGNOSIS — H401121 Primary open-angle glaucoma, left eye, mild stage: Secondary | ICD-10-CM | POA: Diagnosis not present

## 2018-10-16 DIAGNOSIS — H401113 Primary open-angle glaucoma, right eye, severe stage: Secondary | ICD-10-CM | POA: Diagnosis not present

## 2018-10-16 DIAGNOSIS — H2513 Age-related nuclear cataract, bilateral: Secondary | ICD-10-CM | POA: Diagnosis not present

## 2018-11-22 DIAGNOSIS — H401121 Primary open-angle glaucoma, left eye, mild stage: Secondary | ICD-10-CM | POA: Diagnosis not present

## 2018-12-27 DIAGNOSIS — H2513 Age-related nuclear cataract, bilateral: Secondary | ICD-10-CM | POA: Diagnosis not present

## 2018-12-27 DIAGNOSIS — H401121 Primary open-angle glaucoma, left eye, mild stage: Secondary | ICD-10-CM | POA: Diagnosis not present

## 2018-12-27 DIAGNOSIS — H401113 Primary open-angle glaucoma, right eye, severe stage: Secondary | ICD-10-CM | POA: Diagnosis not present

## 2019-11-08 NOTE — Progress Notes (Signed)
Cardiology Office Note:    Date:  11/09/2019   ID:  James Ellison, DOB 1961/03/30, MRN [id number]  PCP:  Seward Carol, MD  Cardiologist:  No primary care provider on file.   Referring MD: Seward Carol, MD   Chief Complaint  Patient presents with  . Chest Pain  . Hyperlipidemia    History of Present Illness:    James Ellison is a 59 y.o. male with a hx of atypical chest pain referred by Dr. Mervyn Gay personal h/o hypertension, cigar smoking a month and hyperlipidemia.  James Ellison has risk factors for vascular disease but no significant family history other than hypertension and diabetes.  As noted above he smokes cigars but does not inhale, has elevated blood pressure and is being treated appropriately, and has untreated hyperlipidemia.  In his current role he has not been as able to stay physically condition as previous.  He does have decreased exertional tolerance compared to prior.  He has gained some weight.  He has a hemoglobin A1c of 5.8, LDL cholesterol 111, triglycerides 369, and he is overweight.  This will qualify as seeing metabolic syndrome.  He has had very vague twitching or pinpoint-like discomfort in the left chest that lasts less than 5 seconds and goes away.  It is not related to physical activity.  There is no associated dyspnea, palpitations, or other cardiac complaints.  Past Medical History:  Diagnosis Date  . Acid indigestion   . Chronic dryness of both eyes 04/20/2016  . Cigar smoker   . GERD (gastroesophageal reflux disease)   . Glaucoma   . History of colon polyps   . Hypertension   . Overweight   . Primary open angle glaucoma (POAG) of left eye, moderate stage 05/19/2017   Overview:  Tim OS BID, Simb OS TID, Latan OS QHS - continue; consider XEN/other MIGS as initial procedure   . Primary open angle glaucoma of right eye, severe stage 04/20/2016   Overview:  tmax 15 OCT 04/03/15 VF (last 10/27/15) 11/08/16 Gonio 11/28/13 Latan QHS OU, Brim TID OU,  Timolol BID OU 32m VF, IOP on new regiment  . Regular astigmatism of both eyes 04/20/2016  . Renal cyst   . Shoulder injury, left, subsequent encounter 06/02/2017  . Sinusitis   . Stress     History reviewed. No pertinent surgical history.  Current Medications: Current Meds  Medication Sig  . ALPRAZolam (XANAX) 0.25 MG tablet TK 1 T PO BID PRN  . hydrochlorothiazide (HYDRODIURIL) 25 MG tablet TK 1 T PO QAM  . latanoprost (XALATAN) 0.005 % ophthalmic solution INT 1 GTT IN OU QHS  . Latanoprostene Bunod (VYZULTA) 0.024 % SOLN Apply to eye.  . losartan (COZAAR) 50 MG tablet Take 50 mg by mouth daily.  James Ellison (RHOPRESSA) 0.02 % SOLN Apply to eye.  . pantoprazole (PROTONIX) 40 MG tablet Take 40 mg by mouth daily.  Marland Kitchen SIMBRINZA 1-0.2 % SUSP SHAKE LQ AND INT 1 GTT IN OU TID  . timolol (TIMOPTIC) 0.5 % ophthalmic solution INT 1 GTT INTO OS BID     Allergies:   Netarsudil and Mucinex [guaifenesin er]   Social History   Socioeconomic History  . Marital status: Married    Spouse name: Not on file  . Number of children: Not on file  . Years of education: Not on file  . Highest education level: Not on file  Occupational History  . Not on file  Tobacco Use  . Smoking status: Current Every  Day Smoker    Types: Cigars  . Smokeless tobacco: Never Used  Substance and Sexual Activity  . Alcohol use: Yes    Comment: occ  . Drug use: No  . Sexual activity: Not on file  Other Topics Concern  . Not on file  Social History Narrative  . Not on file   Social Determinants of Health   Financial Resource Strain:   . Difficulty of Paying Living Expenses:   Food Insecurity:   . Worried About Charity fundraiser in the Last Year:   . Arboriculturist in the Last Year:   Transportation Needs:   . Film/video editor (Medical):   Marland Kitchen Lack of Transportation (Non-Medical):   Physical Activity:   . Days of Exercise per Week:   . Minutes of Exercise per Session:   Stress:   .  Feeling of Stress :   Social Connections:   . Frequency of Communication with Friends and Family:   . Frequency of Social Gatherings with Friends and Family:   . Attends Religious Services:   . Active Member of Clubs or Organizations:   . Attends Archivist Meetings:   Marland Kitchen Marital Status:      Family History: The patient's family history is not on file.  ROS:   Please see the history of present illness.    He is not sleeping as well as he would like.  He occasionally has headaches.  He has glaucoma.  There is stress and anxiety associated with his job.  He continues to smoke 3 cigars/day.  All other systems reviewed and are negative.  EKGs/Labs/Other Studies Reviewed:    The following studies were reviewed today: No cardiac evaluation  EKG:  EKG  performed at [facility] demonstrated prominent voltage, no repolarization abnormality, normal sinus rhythm, and overall normal appearance.  Tracing was performed in early March 2021.  A new tracing was not performed today.  Recent Labs: No results found for requested labs within last 8760 hours.  Recent Lipid Panel No results found for: CHOL, TRIG, HDL, CHOLHDL, VLDL, LDLCALC, LDLDIRECT  Physical Exam:    VS:  BP 124/80   Pulse 86   Ht 6' (1.829 m)   Wt 262 lb (118.8 kg)   SpO2 96%   BMI 35.53 kg/m     Wt Readings from Last 3 Encounters:  11/09/19 262 lb (118.8 kg)  08/29/17 235 lb (106.6 kg)  07/05/17 247 lb (112 kg)     GEN: Overweight.. No acute distress HEENT: Normal NECK: No JVD. LYMPHATICS: No lymphadenopathy CARDIAC:  RRR without murmur, gallop, or edema. VASCULAR:  Normal Pulses. No bruits. RESPIRATORY:  Clear to auscultation without rales, wheezing or rhonchi  ABDOMEN: Soft, non-tender, non-distended, No pulsatile mass, MUSCULOSKELETAL: No deformity  SKIN: Warm and dry NEUROLOGIC:  Alert and oriented x 3 PSYCHIATRIC:  Normal affect   ASSESSMENT:    1. Chest pain of uncertain etiology   2. Metabolic  syndrome   3. Essential hypertension   4. Mixed hyperlipidemia   5. Educated about COVID-19 virus infection   6. Other chest pain    PLAN:    In order of problems listed above:  1. Chest discomfort is very brief, unpredictable, and has no features suggestive of ischemia.  However he does have a concerning risk factor profile which includes overweight, hypertension, tobacco use (cigarette smoking), and hyperlipidemia with LDL most recently of 111.  He is not currently on therapy for lipids and does  not want to be on additional therapy.  I believe the appropriate plan at this time will be to perform a coronary CT calcium score to determine if there is evidence of atherosclerosis.  If so, statin therapy would need to be initiated.  If not, I would recommend aggressive risk factor modification through diet, exercise, weight loss, and improved sleep habits. 2. Weight loss, aerobic activity greater than 150 minutes/week, decrease carbohydrates and the diet, are recommended to improve his current status.  Hemoglobin A1c should be followed closely. 3. Target 130/80 mmHg or less. 4. Given prediabetes/metabolic syndrome diagnosis, would recommend LDL less 70 mg/dL. 5. COVID-19 vaccine has been received.  Practicing social distancing.  Overall education and awareness concerning primary risk prevention was discussed in detail: LDL less than 70, hemoglobin A1c less than 7, blood pressure target less than 130/80 mmHg, >150 minutes of moderate aerobic activity per week, avoidance of smoking, weight control (via diet and exercise), and continued surveillance/management of/for obstructive sleep apnea.    Medication Adjustments/Labs and Tests Ordered: Current medicines are reviewed at length with the patient today.  Concerns regarding medicines are outlined above.  Orders Placed This Encounter  Procedures  . CT CARDIAC SCORING   No orders of the defined types were placed in this encounter.   Patient  Instructions  Medication Instructions:  Your physician recommends that you continue on your current medications as directed. Please refer to the Current Medication list given to you today.  *If you need a refill on your cardiac medications before your next appointment, please call your pharmacy*   Lab Work: None If you have labs (blood work) drawn today and your tests are completely normal, you will receive your results only by: Marland Kitchen MyChart Message (if you have MyChart) OR . A paper copy in the mail If you have any lab test that is abnormal or we need to change your treatment, we will call you to review the results.   Testing/Procedures: Your physician recommends that you have a Calcium Score performed.    Follow-Up: At [facility], you and your health needs are our priority.  As part of our continuing mission to provide you with exceptional heart care, we have created designated Provider Care Teams.  These Care Teams include your primary Cardiologist (physician) and Advanced Practice Providers (APPs -  Physician Assistants and Nurse Practitioners) who all work together to provide you with the care you need, when you need it.  We recommend signing up for the patient portal called "MyChart".  Sign up information is provided on this After Visit Summary.  MyChart is used to connect with patients for Virtual Visits (Telemedicine).  Patients are able to view lab/test results, encounter notes, upcoming appointments, etc.  Non-urgent messages can be sent to your provider as well.   To learn more about what you can do with MyChart, go to [email or web address].    Your next appointment:   As needed  The format for your next appointment:   In Person  Provider:   You may see Dr. Daneen Schick or one of the following Advanced Practice Providers on your designated Care Team:    Truitt Merle, NP  Cecilie Kicks, NP  Kathyrn Drown, NP    Other Instructions      Signed, Sinclair Grooms, MD  11/09/2019 3:09 PM    [facility]

## 2019-11-09 ENCOUNTER — Encounter (INDEPENDENT_AMBULATORY_CARE_PROVIDER_SITE_OTHER): Payer: Self-pay

## 2019-11-09 ENCOUNTER — Other Ambulatory Visit: Payer: Self-pay

## 2019-11-09 ENCOUNTER — Ambulatory Visit: Payer: 59 | Admitting: Interventional Cardiology

## 2019-11-09 ENCOUNTER — Encounter: Payer: Self-pay | Admitting: Interventional Cardiology

## 2019-11-09 VITALS — BP 124/80 | HR 86 | Ht 72.0 in | Wt 262.0 lb

## 2019-11-09 DIAGNOSIS — R0789 Other chest pain: Secondary | ICD-10-CM

## 2019-11-09 DIAGNOSIS — E782 Mixed hyperlipidemia: Secondary | ICD-10-CM

## 2019-11-09 DIAGNOSIS — Z7189 Other specified counseling: Secondary | ICD-10-CM

## 2019-11-09 DIAGNOSIS — E8881 Metabolic syndrome: Secondary | ICD-10-CM

## 2019-11-09 DIAGNOSIS — I10 Essential (primary) hypertension: Secondary | ICD-10-CM

## 2019-11-09 DIAGNOSIS — R079 Chest pain, unspecified: Secondary | ICD-10-CM

## 2019-11-09 NOTE — Patient Instructions (Signed)
Medication Instructions:  Your physician recommends that you continue on your current medications as directed. Please refer to the Current Medication list given to you today.  *If you need a refill on your cardiac medications before your next appointment, please call your pharmacy*   Lab Work: None If you have labs (blood work) drawn today and your tests are completely normal, you will receive your results only by: . MyChart Message (if you have MyChart) OR . A paper copy in the mail If you have any lab test that is abnormal or we need to change your treatment, we will call you to review the results.   Testing/Procedures: Your physician recommends that you have a Calcium Score performed.   Follow-Up: At [facility], you and your health needs are our priority.  As part of our continuing mission to provide you with exceptional heart care, we have created designated Provider Care Teams.  These Care Teams include your primary Cardiologist (physician) and Advanced Practice Providers (APPs -  Physician Assistants and Nurse Practitioners) who all work together to provide you with the care you need, when you need it.  We recommend signing up for the patient portal called "MyChart".  Sign up information is provided on this After Visit Summary.  MyChart is used to connect with patients for Virtual Visits (Telemedicine).  Patients are able to view lab/test results, encounter notes, upcoming appointments, etc.  Non-urgent messages can be sent to your provider as well.   To learn more about what you can do with MyChart, go to [email or web address].    Your next appointment:   As needed  The format for your next appointment:   In Person  Provider:   You may see Dr. Henry Smith or one of the following Advanced Practice Providers on your designated Care Team:    Lori Gerhardt, NP  Laura Ingold, NP  Jill McDaniel, NP    Other Instructions   

## 2019-11-14 ENCOUNTER — Telehealth: Payer: Self-pay | Admitting: Interventional Cardiology

## 2019-11-14 NOTE — Telephone Encounter (Signed)
I will route call to Primary Card RN as I am not sure who did call the pt.

## 2019-11-14 NOTE — Telephone Encounter (Signed)
Spoke with pt and reminded him of appt tomorrow for CT.  Advised to come at 1130 and reminded him of $150 out of pocket cost.  Pt verbalized understanding and was appreciative for call.

## 2019-11-14 NOTE — Telephone Encounter (Signed)
Patient was returning a call he received from our office. Pt was not sure if it was more than just a reminder call for his CT scheduled for tomorrow. Please call the patient if needed

## 2019-11-15 ENCOUNTER — Ambulatory Visit (INDEPENDENT_AMBULATORY_CARE_PROVIDER_SITE_OTHER)
Admission: RE | Admit: 2019-11-15 | Discharge: 2019-11-15 | Disposition: A | Discharge status: Home / Self Care | Payer: 59 | Source: Ambulatory Visit | Attending: Interventional Cardiology | Admitting: Interventional Cardiology

## 2019-11-15 ENCOUNTER — Other Ambulatory Visit: Payer: Self-pay

## 2019-11-15 DIAGNOSIS — R0789 Other chest pain: Secondary | ICD-10-CM

## 2020-06-05 ENCOUNTER — Other Ambulatory Visit: Payer: Self-pay | Admitting: Internal Medicine

## 2020-06-05 ENCOUNTER — Ambulatory Visit
Admission: RE | Admit: 2020-06-05 | Discharge: 2020-06-05 | Disposition: A | Discharge status: Home / Self Care | Payer: 59 | Source: Ambulatory Visit | Attending: Internal Medicine | Admitting: Internal Medicine

## 2020-06-05 ENCOUNTER — Other Ambulatory Visit: Payer: Self-pay

## 2020-06-05 DIAGNOSIS — M25559 Pain in unspecified hip: Secondary | ICD-10-CM

## 2020-10-02 ENCOUNTER — Other Ambulatory Visit: Payer: Self-pay | Admitting: Family Medicine

## 2020-10-02 ENCOUNTER — Other Ambulatory Visit: Payer: Self-pay

## 2020-10-02 ENCOUNTER — Ambulatory Visit: Payer: Self-pay

## 2020-10-02 DIAGNOSIS — M25552 Pain in left hip: Secondary | ICD-10-CM

## 2022-06-28 ENCOUNTER — Ambulatory Visit
Admission: RE | Admit: 2022-06-28 | Discharge: 2022-06-28 | Disposition: A | Discharge status: Home / Self Care | Payer: 59 | Source: Ambulatory Visit | Attending: Internal Medicine | Admitting: Internal Medicine

## 2022-06-28 ENCOUNTER — Other Ambulatory Visit: Payer: Self-pay

## 2022-06-28 DIAGNOSIS — M546 Pain in thoracic spine: Secondary | ICD-10-CM

## 2022-07-01 IMAGING — CR DG HIP (WITH OR WITHOUT PELVIS) 2-3V*L*
2 series · 2 of 2 positions shown · non-contrast
Comparison: 12/30/2011

CLINICAL DATA: Hip pain

EXAM:
DG HIP (WITH OR WITHOUT PELVIS) 2-3V LEFT

[w pelvis * (1 of 2)]
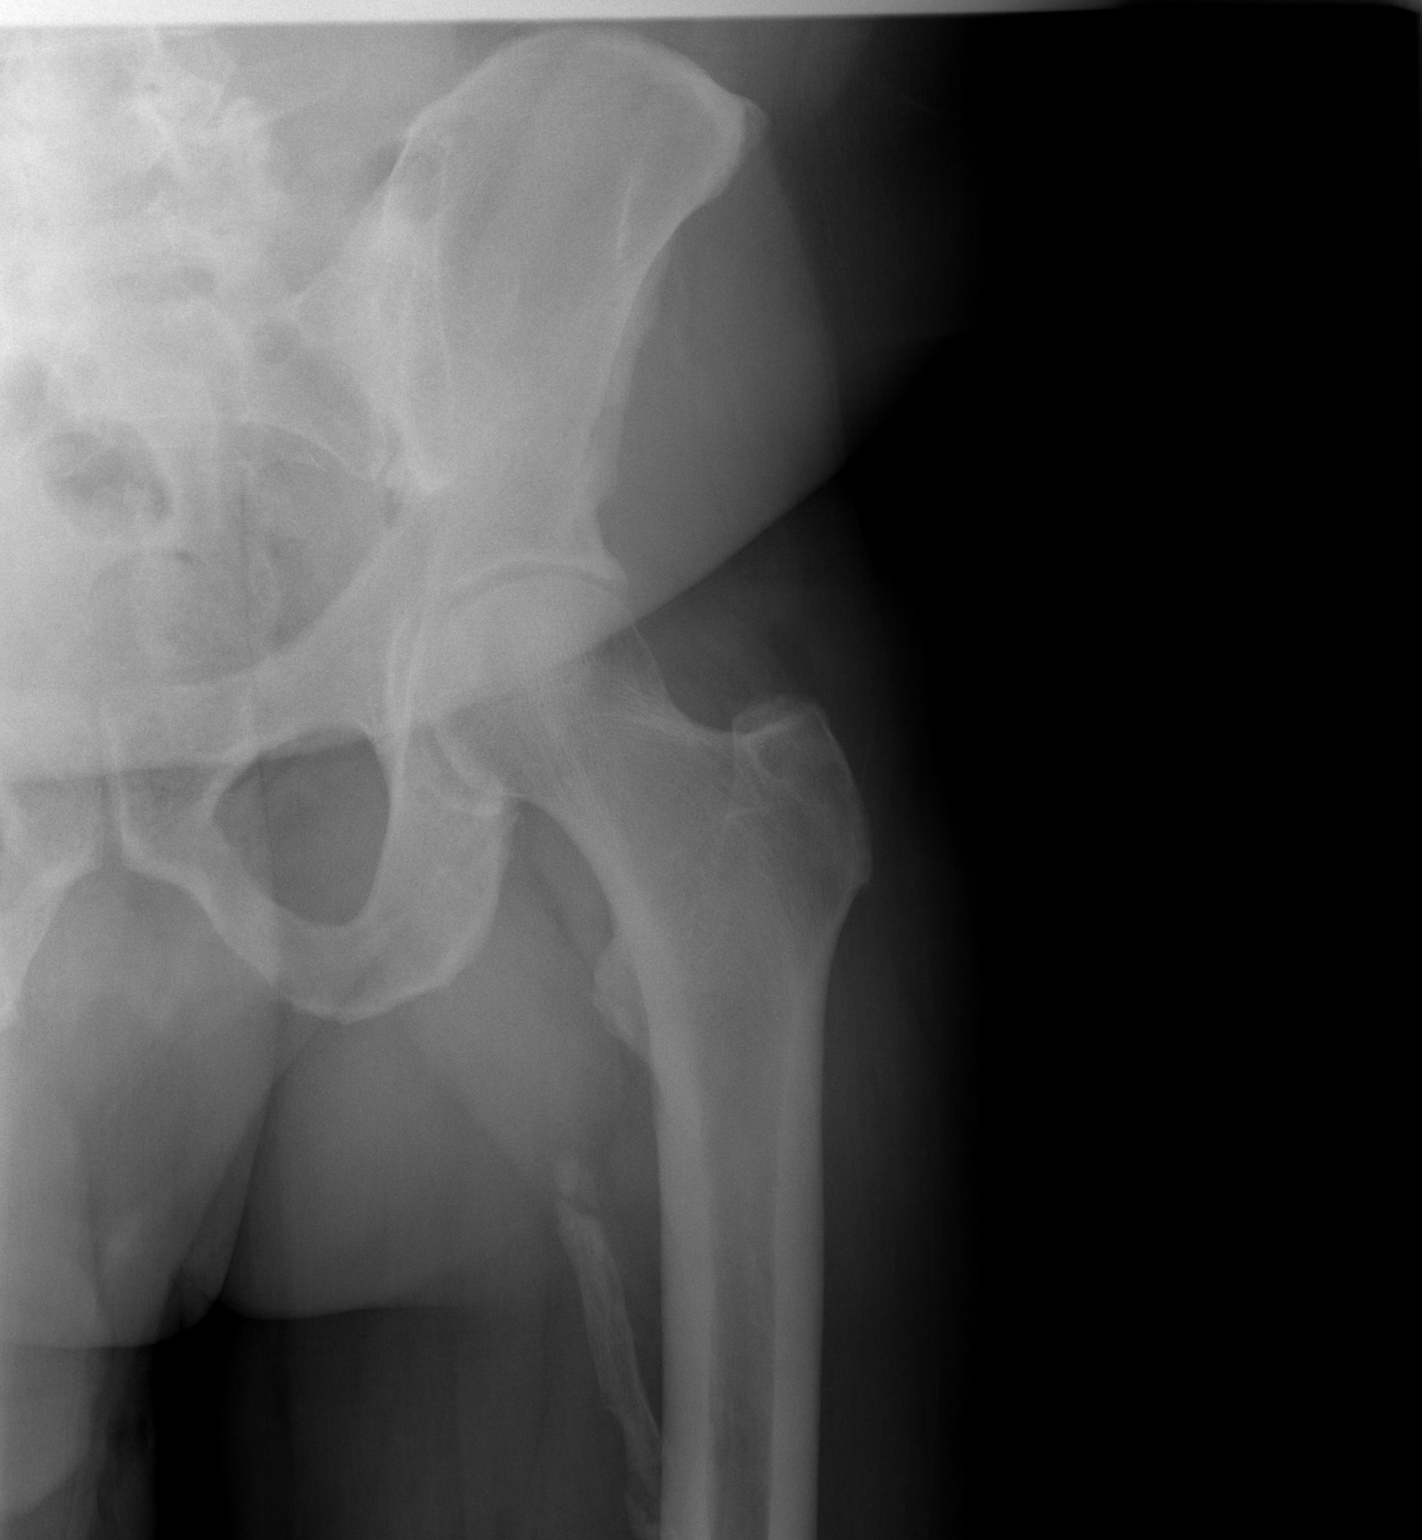

[w pelvis * (2 of 2)]
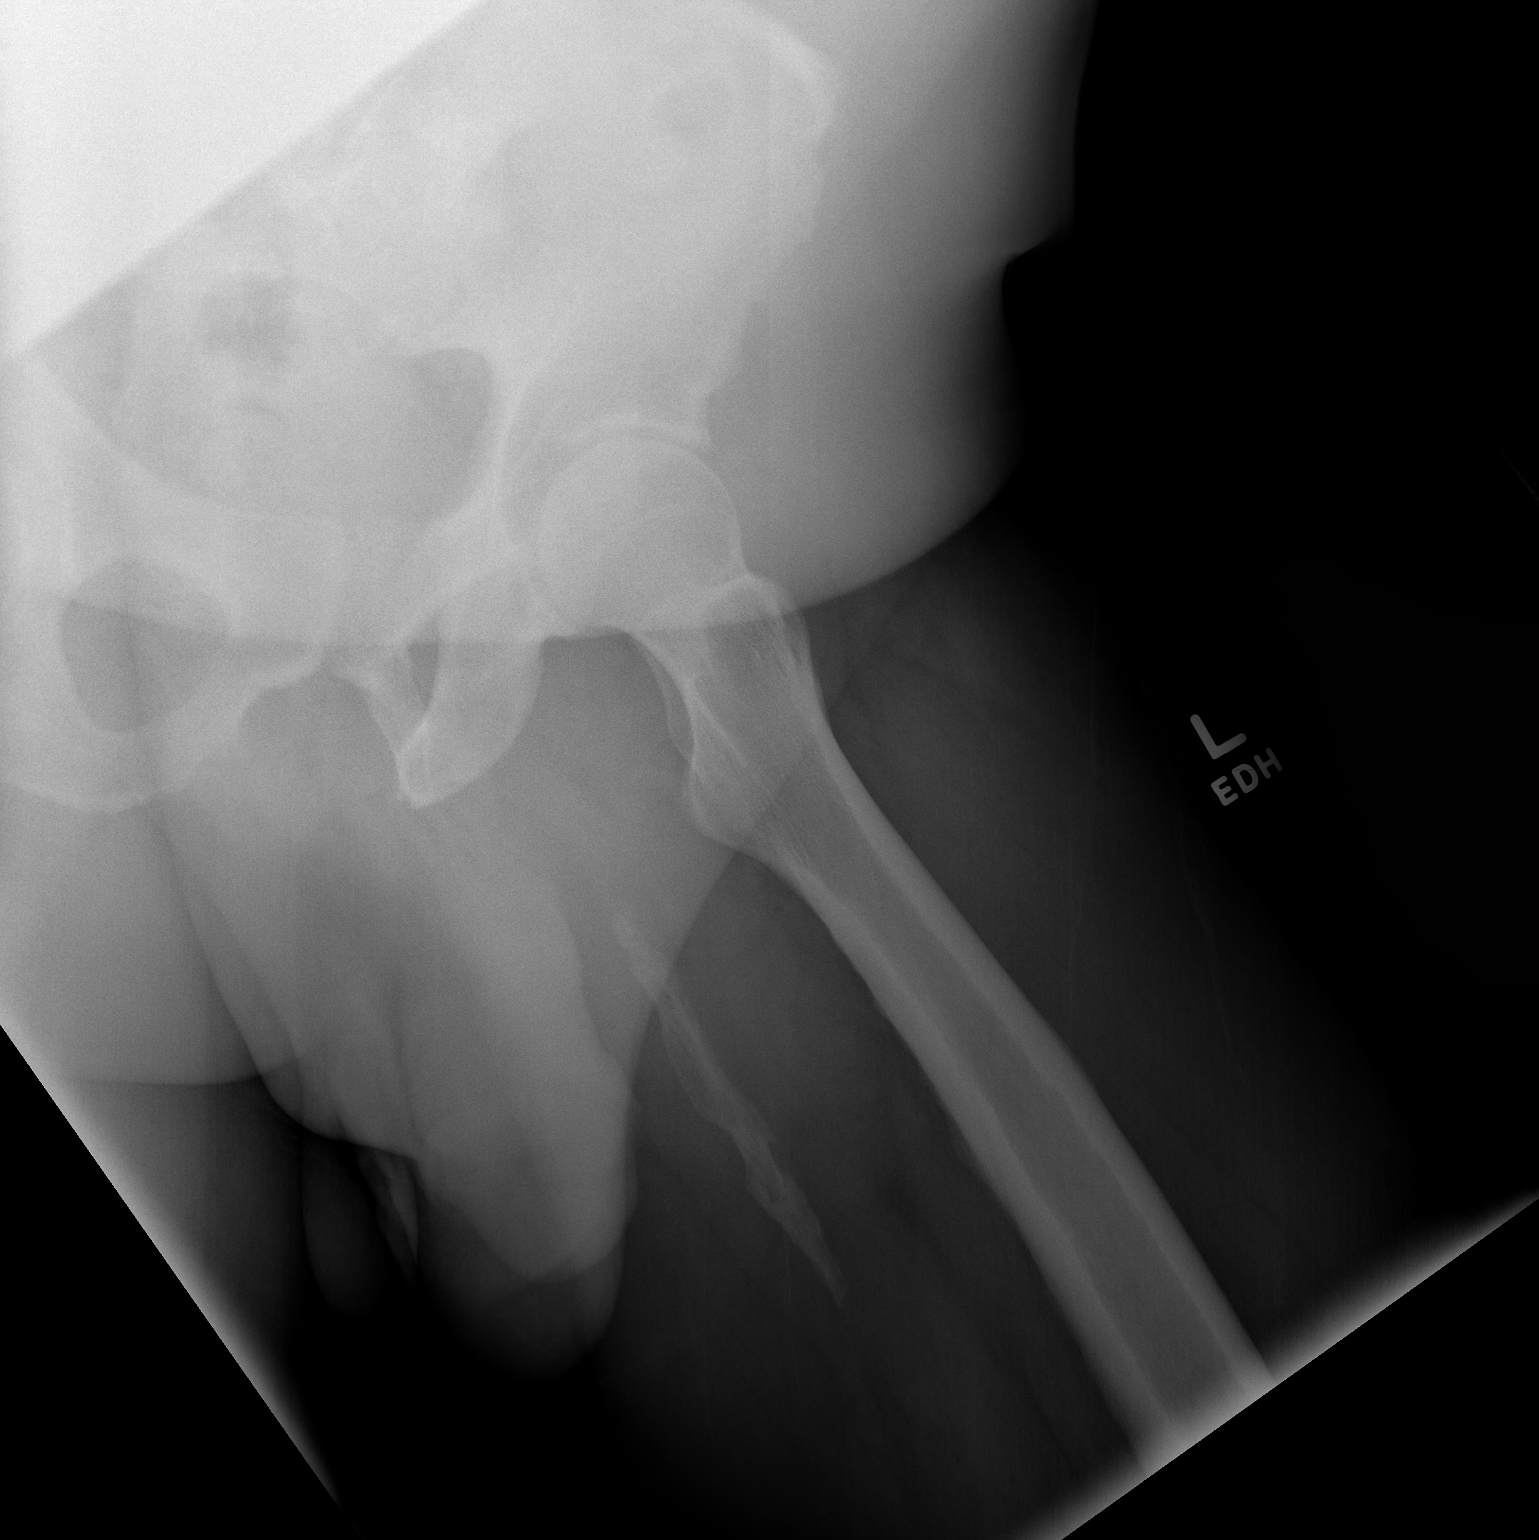

[2 of 2 positions shown; findings below may reference images not displayed]

FINDINGS: No fracture or malalignment. Mild joint space narrowing. 12.2 cm
linear and sheet like calcification within the inter upper thigh.
IMPRESSION: 1. Mild degenerative change. No acute osseous abnormality.
2. 12.2 cm linear and sheet like calcification in the inter upper
thigh, nonspecific, possibly due to prior soft tissue trauma.
